# Patient Record
Sex: Female | Born: 1995 | Race: White | Hispanic: No | Marital: Married | Smoking: Never smoker
Health system: Southern US, Community
[De-identification: ages and names within clinical notes are randomized; demographics above are authoritative.]

---

## 2019-09-05 ENCOUNTER — Encounter (HOSPITAL_COMMUNITY): Payer: Self-pay

## 2019-09-05 ENCOUNTER — Emergency Department (HOSPITAL_COMMUNITY): Payer: HRSA Program

## 2019-09-05 ENCOUNTER — Other Ambulatory Visit: Payer: Self-pay

## 2019-09-05 DIAGNOSIS — U071 COVID-19: Secondary | ICD-10-CM | POA: Insufficient documentation

## 2019-09-05 DIAGNOSIS — R0602 Shortness of breath: Secondary | ICD-10-CM | POA: Diagnosis present

## 2019-09-05 NOTE — ED Triage Notes (Signed)
Covid + 09/03/2019. Shob and pain on inspiration. Cough and loss of taste.

## 2019-09-06 ENCOUNTER — Encounter (HOSPITAL_COMMUNITY): Payer: Self-pay

## 2019-09-06 ENCOUNTER — Emergency Department (HOSPITAL_COMMUNITY): Payer: HRSA Program

## 2019-09-06 ENCOUNTER — Emergency Department (HOSPITAL_COMMUNITY)
Admission: EM | Admit: 2019-09-06 | Discharge: 2019-09-06 | Disposition: A | Discharge status: Home / Self Care | Payer: HRSA Program | Attending: Emergency Medicine | Admitting: Emergency Medicine

## 2019-09-06 DIAGNOSIS — U071 COVID-19: Secondary | ICD-10-CM

## 2019-09-06 LAB — COMPREHENSIVE METABOLIC PANEL
ALT: 93 U/L — ABNORMAL HIGH (ref 0–44)
AST: 71 U/L — ABNORMAL HIGH (ref 15–41)
Albumin: 4.3 g/dL (ref 3.5–5.0)
Alkaline Phosphatase: 55 U/L (ref 38–126)
Anion gap: 11 (ref 5–15)
BUN: 6 mg/dL (ref 6–20)
CO2: 25 mmol/L (ref 22–32)
Calcium: 8.9 mg/dL (ref 8.9–10.3)
Chloride: 101 mmol/L (ref 98–111)
Creatinine, Ser: 0.88 mg/dL (ref 0.44–1.00)
GFR calc Af Amer: >60 mL/min (ref >60)
GFR calc non Af Amer: >60 mL/min (ref >60)
Glucose, Bld: 95 mg/dL (ref 70–99)
Potassium: 4.1 mmol/L (ref 3.5–5.1)
Sodium: 137 mmol/L (ref 135–145)
Total Bilirubin: 0.3 mg/dL (ref 0.3–1.2)
Total Protein: 8.3 g/dL — ABNORMAL HIGH (ref 6.5–8.1)

## 2019-09-06 LAB — CBC WITH DIFFERENTIAL/PLATELET
Abs Immature Granulocytes: 0 10*3/uL (ref 0.00–0.07)
Basophils Absolute: 0 10*3/uL (ref 0.0–0.1)
Basophils Relative: 1 %
Eosinophils Absolute: 0 10*3/uL (ref 0.0–0.5)
Eosinophils Relative: 0 %
HCT: 46.4 % — ABNORMAL HIGH (ref 36.0–46.0)
Hemoglobin: 15.3 g/dL — ABNORMAL HIGH (ref 12.0–15.0)
Immature Granulocytes: 0 %
Lymphocytes Relative: 46 %
Lymphs Abs: 1.2 10*3/uL (ref 0.7–4.0)
MCH: 28.4 pg (ref 26.0–34.0)
MCHC: 33 g/dL (ref 30.0–36.0)
MCV: 86.2 fL (ref 80.0–100.0)
Monocytes Absolute: 0.4 10*3/uL (ref 0.1–1.0)
Monocytes Relative: 15 %
Neutro Abs: 1 10*3/uL — ABNORMAL LOW (ref 1.7–7.7)
Neutrophils Relative %: 38 %
Platelets: 121 10*3/uL — ABNORMAL LOW (ref 150–400)
RBC: 5.38 MIL/uL — ABNORMAL HIGH (ref 3.87–5.11)
RDW: 12.2 % (ref 11.5–15.5)
WBC: 2.6 10*3/uL — ABNORMAL LOW (ref 4.0–10.5)
nRBC: 0 % (ref 0.0–0.2)

## 2019-09-06 LAB — I-STAT BETA HCG BLOOD, ED (MC, WL, AP ONLY): I-stat hCG, quantitative: <5 m[IU]/mL (ref <5)

## 2019-09-06 LAB — D-DIMER, QUANTITATIVE: D-Dimer, Quant: 0.51 ug/mL-FEU — ABNORMAL HIGH (ref 0.00–0.50)

## 2019-09-06 MED ORDER — LACTATED RINGERS IV BOLUS
1000.0000 mL | Freq: Once | INTRAVENOUS | Status: AC
  Administered 2019-09-06: 1000 mL via INTRAVENOUS

## 2019-09-06 MED ORDER — KETOROLAC TROMETHAMINE 15 MG/ML IJ SOLN
15.0000 mg | Freq: Once | INTRAMUSCULAR | Status: AC
  Administered 2019-09-06: 15 mg via INTRAVENOUS
  Filled 2019-09-06: qty 1

## 2019-09-06 MED ORDER — SODIUM CHLORIDE (PF) 0.9 % IJ SOLN
INTRAMUSCULAR | Status: AC
  Filled 2019-09-06: qty 50

## 2019-09-06 MED ORDER — IOHEXOL 350 MG/ML SOLN
100.0000 mL | Freq: Once | INTRAVENOUS | Status: AC | PRN
  Administered 2019-09-06: 100 mL via INTRAVENOUS

## 2019-09-06 MED ORDER — ACETAMINOPHEN 325 MG PO TABS
650.0000 mg | ORAL_TABLET | Freq: Once | ORAL | Status: AC
  Administered 2019-09-06: 650 mg via ORAL
  Filled 2019-09-06: qty 2

## 2019-09-06 NOTE — ED Provider Notes (Signed)
[facility]-EMERGENCY DEPT Provider Note   CSN: [id number] Arrival date & time: 09/05/19  2315     History Chief Complaint  Patient presents with  . Shortness of Breath    Christina Garrett is a 24 y.o. female.  HPI 24 year old female presents with Covid symptoms.  States 8 days ago she developed fever, cough, shortness of breath, generalized weakness and headache.  Symptoms seem to be progressively worsening.  3 days ago she tested positive for Covid-19.  States that since last night she seems to be even worse from a shortness of breath perspective.  Bilateral lower rib/upper abdomen hurt with deep breaths.  No hemoptysis.  Feels generally weak.  History reviewed. No pertinent past medical history.  There are no problems to display for this patient.   History reviewed. No pertinent surgical history.   OB History   No obstetric history on file.     No family history on file.  Social History   Tobacco Use  . Smoking status: Not on file  Substance Use Topics  . Alcohol use: Not on file  . Drug use: Not on file    Home Medications Prior to Admission medications   Not on File    Allergies    Patient has no known allergies.  Review of Systems   Review of Systems  Constitutional: Positive for fever.  Respiratory: Positive for cough and shortness of breath.   Cardiovascular: Negative for chest pain.  Gastrointestinal: Positive for abdominal pain (with coughing) and nausea. Negative for vomiting.  Genitourinary: Negative for menstrual problem.  Neurological: Positive for weakness.  All other systems reviewed and are negative.   Physical Exam Updated Vital Signs BP 117/74   Pulse 72   Temp 99.9 F (37.7 C) (Oral)   Resp 16   Ht 5\' 7"  (1.702 m)   Wt 68 kg   SpO2 96%   BMI 23.49 kg/m   Physical Exam Vitals and nursing note reviewed.  Constitutional:      General: She is not in acute distress.    Appearance: She is well-developed. She is  not ill-appearing or diaphoretic.  HENT:     Head: Normocephalic and atraumatic.     Right Ear: External ear normal.     Left Ear: External ear normal.     Nose: Nose normal.  Eyes:     General:        Right eye: No discharge.        Left eye: No discharge.  Cardiovascular:     Rate and Rhythm: Regular rhythm. Tachycardia present.     Heart sounds: Normal heart sounds.  Pulmonary:     Effort: Pulmonary effort is normal. No tachypnea or accessory muscle usage.     Breath sounds: Normal breath sounds. No wheezing, rhonchi or rales.  Abdominal:     Palpations: Abdomen is soft.     Tenderness: There is no abdominal tenderness.  Skin:    General: Skin is warm and dry.  Neurological:     Mental Status: She is alert.  Psychiatric:        Mood and Affect: Mood is not anxious.     ED Results / Procedures / Treatments   Labs (all labs ordered are listed, but only abnormal results are displayed) Labs Reviewed  COMPREHENSIVE METABOLIC PANEL - Abnormal; Notable for the following components:      Result Value   Total Protein 8.3 (*)    AST 71 (*)  ALT 93 (*)    All other components within normal limits  CBC WITH DIFFERENTIAL/PLATELET - Abnormal; Notable for the following components:   WBC 2.6 (*)    RBC 5.38 (*)    Hemoglobin 15.3 (*)    HCT 46.4 (*)    Platelets 121 (*)    Neutro Abs 1.0 (*)    All other components within normal limits  D-DIMER, QUANTITATIVE (NOT AT [facility]) - Abnormal; Notable for the following components:   D-Dimer, Quant 0.51 (*)    All other components within normal limits  I-STAT BETA HCG BLOOD, ED ([facility], [facility], [facility] ONLY)    EKG EKG Interpretation  Date/Time:  Thursday Sep 06 2019 96:22:29 EDT Ventricular Rate:  91 PR Interval:    QRS Duration: 87 QT Interval:  350 QTC Calculation: 431 R Axis:   72 Text Interpretation: Sinus rhythm RSR' in V1 or V2, probably normal variant Borderline T abnormalities, diffuse leads 12 Lead; Mason-Likar No old tracing to  compare Confirmed by Pricilla Loveless [id number]) on 09/06/2019 8:40:58 AM   Radiology DG Chest 2 View  Result Date: 09/05/2019 CLINICAL DATA:  Shortness of breath and cough EXAM: CHEST - 2 VIEW COMPARISON:  None. FINDINGS: Small left lower lobe infiltrate. No pleural effusion. Normal heart size. No pneumothorax IMPRESSION: Small left lower lobe pneumonia. Electronically Signed   By: Jasmine Pang M.D.   On: 09/05/2019 23:48   CT Angio Chest PE W and/or Wo Contrast  Result Date: 09/06/2019 CLINICAL DATA:  COVID positive, pulmonary embolism expected. Elevated D-dimer. EXAM: CT ANGIOGRAPHY CHEST WITH CONTRAST TECHNIQUE: Multidetector CT imaging of the chest was performed using the standard protocol during bolus administration of intravenous contrast. Multiplanar CT image reconstructions and MIPs were obtained to evaluate the vascular anatomy. CONTRAST:  OMNIPAQUE IOHEXOL 350 MG/ML SOLN COMPARISON:  Chest radiograph May 26 21 FINDINGS: Cardiovascular: No filling defects within the pulmonary arteries to suggest acute pulmonary embolism. Mediastinum/Nodes: No axillary supraclavicular adenopathy no mediastinal hilar adenopathy no pericardial effusion. Esophagus normal Lungs/Pleura: There is patchy peripheral airspace densities within the LEFT and RIGHT lower lobes. Similar rounded peripheral airspace densities in the upper lobes to a lesser extent. No pleural fluid. No pneumothorax. Upper Abdomen: Limited view of the liver, kidneys, pancreas are unremarkable. Normal adrenal glands. Musculoskeletal: No aggressive osseous lesion Review of the MIP images confirms the above findings. IMPRESSION: 1. No evidence acute pulmonary embolism. 2. Bilateral multifocal peripheral rounded airspace densities consistent with COVID viral pneumonia. Electronically Signed   By: Genevive Bi M.D.   On: 09/06/2019 11:35    Procedures Procedures (including critical care time)  Medications Ordered in ED Medications  sodium  chloride (PF) 0.9 % injection (has no administration in time range)  lactated ringers bolus 1,000 mL (0 mLs Intravenous Stopped 09/06/19 1146)  ketorolac (TORADOL) 15 MG/ML injection 15 mg (15 mg Intravenous Given 09/06/19 0850)  acetaminophen (TYLENOL) tablet 650 mg (650 mg Oral Given 09/06/19 0850)  iohexol (OMNIPAQUE) 350 MG/ML injection 100 mL (100 mLs Intravenous Contrast Given 09/06/19 1110)    ED Course  I have reviewed the triage vital signs and the nursing notes.  Pertinent labs & imaging results that were available during my care of the patient were reviewed by me and considered in my medical decision making (see chart for details).    MDM Rules/Calculators/A&P                      Given patient is on birth control  and with an acute worsening of her symptoms with COVID-19, work-up for PE obtained.  Labs are fairly benign besides mild leukopenia and mild LFT abnormality.  Minimal D-dimer elevation and thus CTA obtained but is unremarkable besides Covid-19.  Feeling better with Toradol and fluids.  Recommend continued quarantine and supportive care.   Bentleigh Waren was evaluated in Emergency Department on 09/06/2019 for the symptoms described in the history of present illness. She was evaluated in the context of the global COVID-19 pandemic, which necessitated consideration that the patient might be at risk for infection with the SARS-CoV-2 virus that causes COVID-19. Institutional protocols and algorithms that pertain to the evaluation of patients at risk for COVID-19 are in a state of rapid change based on information released by regulatory bodies including the CDC and federal and state organizations. These policies and algorithms were followed during the patient's care in the ED.  Final Clinical Impression(s) / ED Diagnoses Final diagnoses:  COVID-19 virus infection    Rx / DC Orders ED Discharge Orders    None       Sherwood Gambler, MD 09/06/19 [phone]

## 2021-09-11 ENCOUNTER — Emergency Department (HOSPITAL_BASED_OUTPATIENT_CLINIC_OR_DEPARTMENT_OTHER)
Admission: EM | Admit: 2021-09-11 | Discharge: 2021-09-11 | Disposition: A | Discharge status: Home / Self Care | Payer: Managed Care, Other (non HMO) | Attending: Emergency Medicine | Admitting: Emergency Medicine

## 2021-09-11 ENCOUNTER — Encounter (HOSPITAL_BASED_OUTPATIENT_CLINIC_OR_DEPARTMENT_OTHER): Payer: Self-pay | Admitting: *Deleted

## 2021-09-11 ENCOUNTER — Other Ambulatory Visit: Payer: Self-pay

## 2021-09-11 DIAGNOSIS — S61211A Laceration without foreign body of left index finger without damage to nail, initial encounter: Secondary | ICD-10-CM | POA: Insufficient documentation

## 2021-09-11 DIAGNOSIS — S60941A Unspecified superficial injury of left index finger, initial encounter: Secondary | ICD-10-CM | POA: Diagnosis present

## 2021-09-11 DIAGNOSIS — W260XXA Contact with knife, initial encounter: Secondary | ICD-10-CM | POA: Diagnosis not present

## 2021-09-11 NOTE — ED Notes (Signed)
Discharge instructions reviewed and explained, pt verbalized understanding and had no further questions at discharge. Pt caox4 and ambulatory at d/c.

## 2021-09-11 NOTE — ED Provider Notes (Signed)
[facility] EMERGENCY DEPT Provider Note   CSN: [id number] Arrival date & time: 09/11/21  1940     History  Chief Complaint  Patient presents with   Extremity Laceration    Christina Garrett is a 26 y.o. female.  Patient presents to the hospital complaining of a laceration to the left index finger.  Patient states that she was cutting an avocado when she slipped and the knife went into the base of the left finger.  She has good pulse much, movement, sensation in the left finger at this time.  Bleeding is controlled.  No other complaints.  Her last tetanus shot was this year  HPI     Home Medications Prior to Admission medications   Not on File      Allergies    Patient has no known allergies.    Review of Systems   Review of Systems  Musculoskeletal:  Negative for arthralgias.  Skin:  Positive for wound.   Physical Exam Updated Vital Signs BP 138/72 (BP Location: Right Arm)   Pulse 66   Temp 97.9 F (36.6 C) (Oral)   Resp 20   Wt 79.4 kg   SpO2 100%   BMI 27.41 kg/m  Physical Exam Vitals and nursing note reviewed.  Constitutional:      General: She is not in acute distress. HENT:     Head: Normocephalic and atraumatic.  Eyes:     Conjunctiva/sclera: Conjunctivae normal.  Cardiovascular:     Rate and Rhythm: Normal rate.  Pulmonary:     Effort: Pulmonary effort is normal.  Musculoskeletal:        General: Signs of injury present. No swelling or tenderness. Normal range of motion.     Cervical back: Normal range of motion and neck supple.  Skin:    General: Skin is warm and dry.     Comments: 5 mm vertical laceration noted at base of left index finger  Neurological:     Mental Status: She is alert and oriented to person, place, and time.    ED Results / Procedures / Treatments   Labs (all labs ordered are listed, but only abnormal results are displayed) Labs Reviewed - No data to display  EKG None  Radiology No results  found.  Procedures .Marland KitchenLaceration Repair  Date/Time: 09/11/2021 9:08 PM Performed by: Darrick Grinder, PA-C Authorized by: Darrick Grinder, PA-C   Consent:    Consent obtained:  Verbal   Consent given by:  Patient   Risks discussed:  Infection, need for additional repair, pain, poor cosmetic result and poor wound healing   Alternatives discussed:  No treatment and delayed treatment Universal protocol:    Procedure explained and questions answered to patient or proxy's satisfaction: yes     Relevant documents present and verified: yes     Required blood products, implants, devices, and special equipment available: yes     Site/side marked: yes     Immediately prior to procedure, a time out was called: yes     Patient identity confirmed:  Verbally with patient Anesthesia:    Anesthesia method:  None Laceration details:    Location:  Finger   Finger location:  L index finger   Length (cm):  0.5   Depth (mm):  3 Exploration:    Hemostasis achieved with:  Direct pressure Treatment:    Area cleansed with:  Saline   Amount of cleaning:  Standard Skin repair:    Repair method:  Tissue adhesive  Approximation:    Approximation:  Close Repair type:    Repair type:  Simple Post-procedure details:    Dressing:  Non-adherent dressing   Procedure completion:  Tolerated well, no immediate complications    Medications Ordered in ED Medications - No data to display  ED Course/ Medical Decision Making/ A&P                           Medical Decision Making  The patient presented with a laceration of the left index finger.  Pulse movement and sensation was intact distal to the laceration.  I saw no need for imaging at this time.  The wounds was cleansed as noted above and closed with surgical glue.  It was bandaged with a nonadhesive dressing.  The patient is discharged home with instructions for wound care.        Final Clinical Impression(s) / ED Diagnoses Final diagnoses:   Laceration of left index finger without foreign body without damage to nail, initial encounter    Rx / DC Orders ED Discharge Orders     None         Pamala Duffel 09/11/21 2110    Terrilee Files, MD 09/12/21 [phone]

## 2021-09-11 NOTE — ED Triage Notes (Signed)
Pt cut herself at the base pf her left index finger while cutting an avocado.  Bleeding controlled, about 35mm cut.  Last tetanus shot was this year

## 2021-09-11 NOTE — Discharge Instructions (Signed)
You were seen today for a finger laceration.  This was repaired with surgical glue.  Please let the glue fall off on its own.  Do not peel it off.  Follow-up as needed.

## 2021-09-18 IMAGING — CR DG CHEST 2V
2 series · 2 of 2 positions shown · non-contrast
Comparison: None.

CLINICAL DATA: Shortness of breath and cough

EXAM:
CHEST - 2 VIEW

[w chest pa]
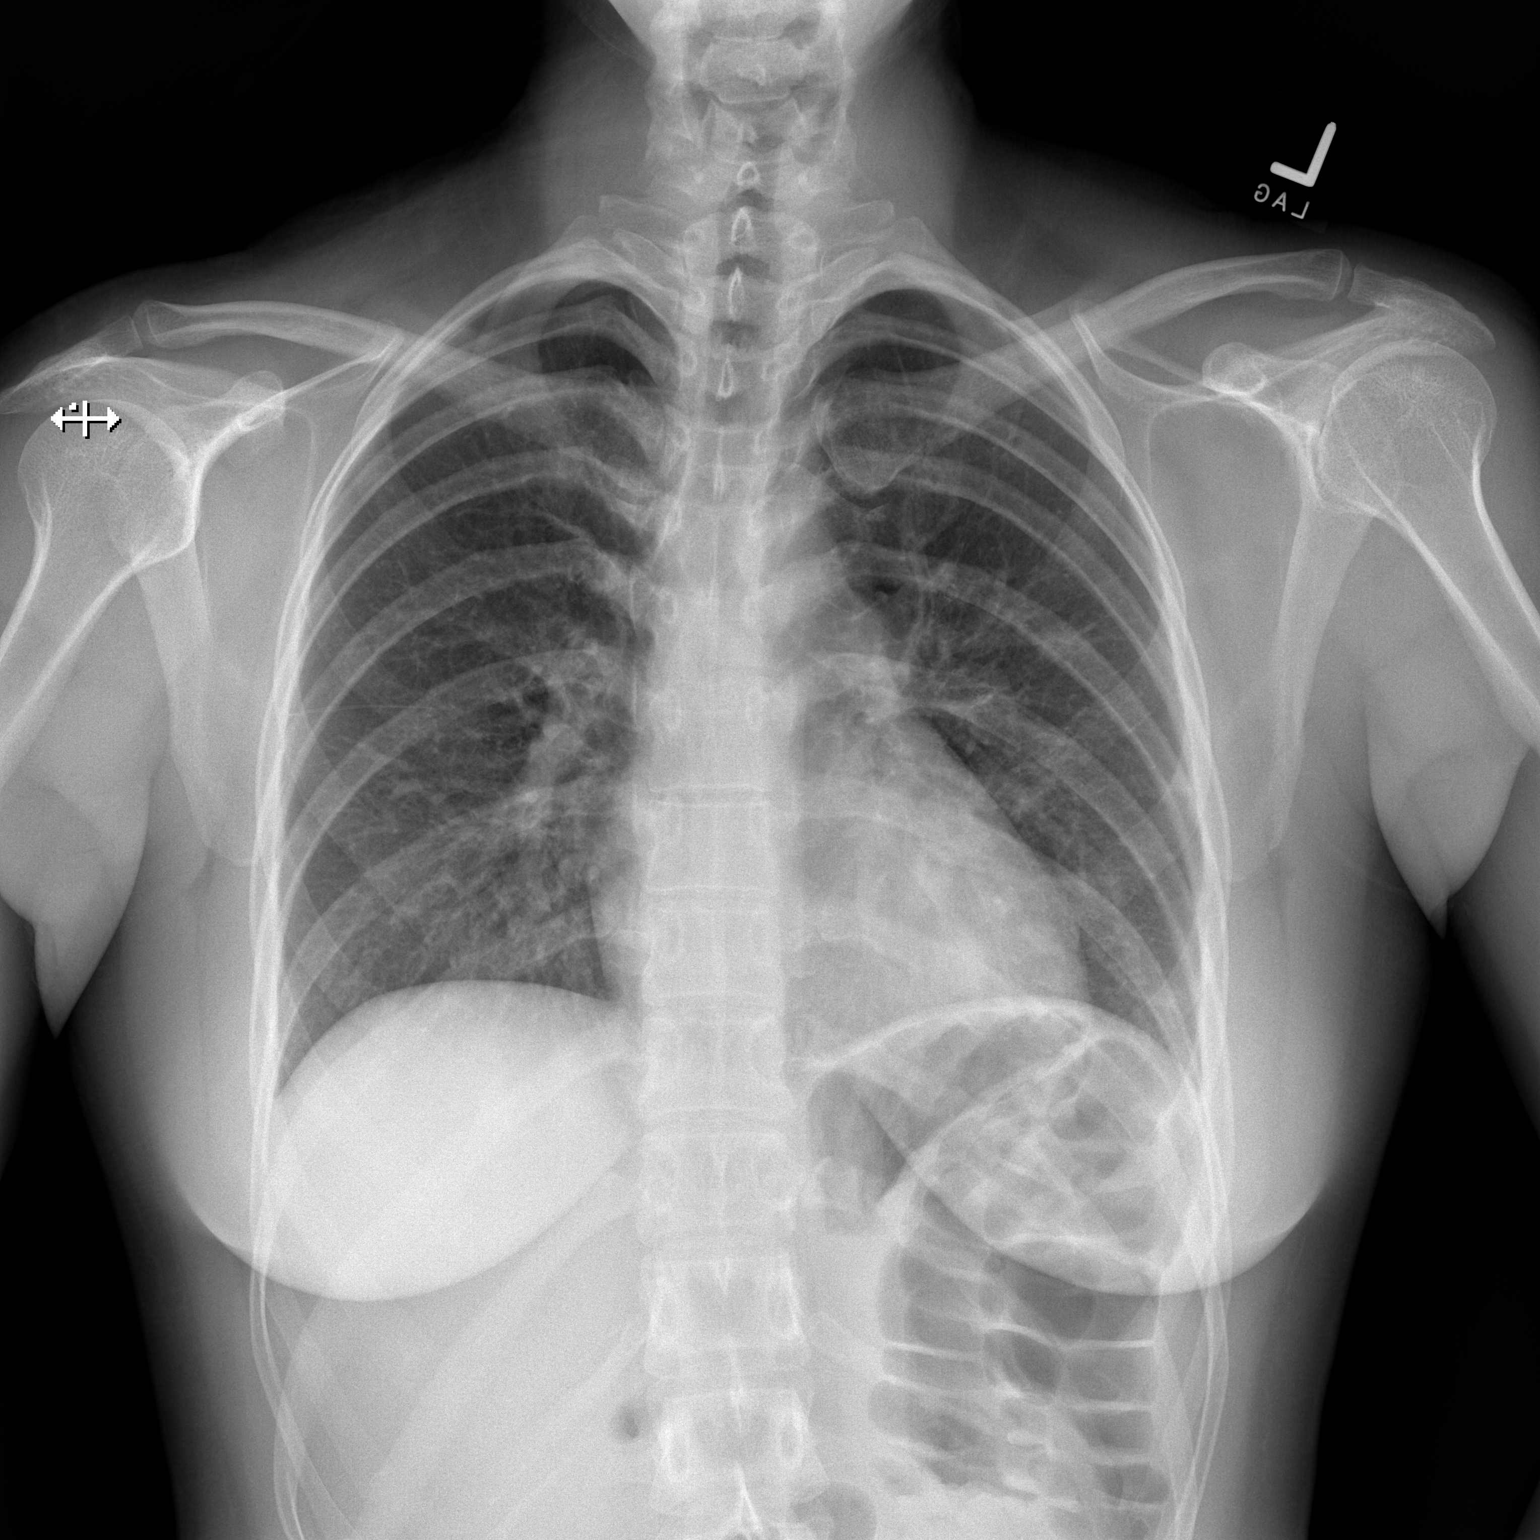

[w chest lat]
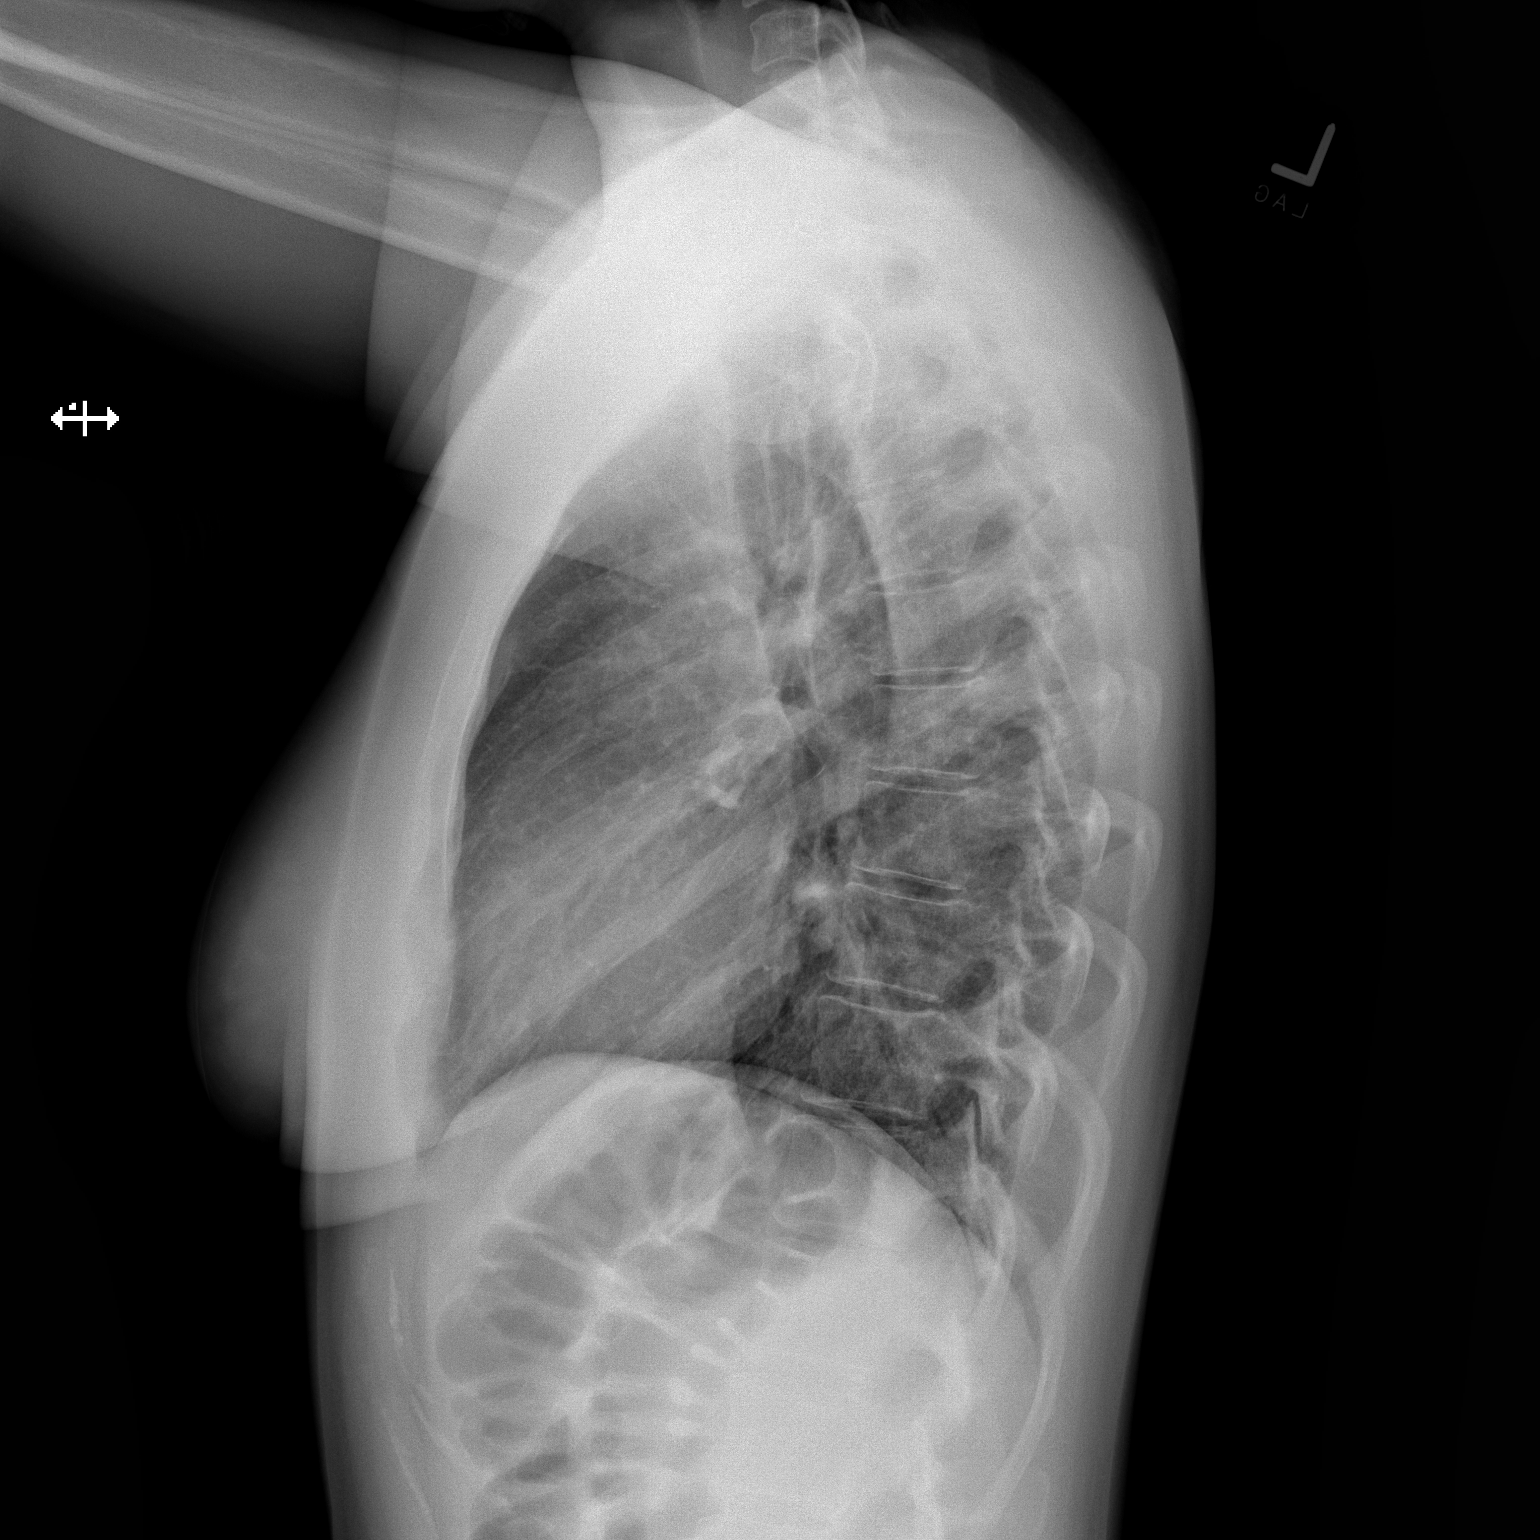

[2 of 2 positions shown; findings below may reference images not displayed]

FINDINGS: Small left lower lobe infiltrate. No pleural effusion. Normal heart
size. No pneumothorax
IMPRESSION: Small left lower lobe pneumonia.

## 2021-09-19 IMAGING — CT CT ANGIO CHEST
2 of 6 series · 18 of 36 positions shown · IV contrast (omnipaque)
Comparison: Chest radiograph September 05, 19

CLINICAL DATA: COVID positive, pulmonary embolism expected.
Elevated D-dimer.

EXAM:
CT ANGIOGRAPHY CHEST WITH CONTRAST
TECHNIQUE: Multidetector CT imaging of the chest was performed using the
standard protocol during bolus administration of intravenous
contrast. Multiplanar CT image reconstructions and MIPs were
obtained to evaluate the vascular anatomy.
CONTRAST:  100mL OMNIPAQUE IOHEXOL 350 MG/ML SOLN

[Series 6: thins · axial · 0.65mm/px · z∈[+1461,+1690]mm · 17 of 259 slices shown]
[im 15/259  lung]
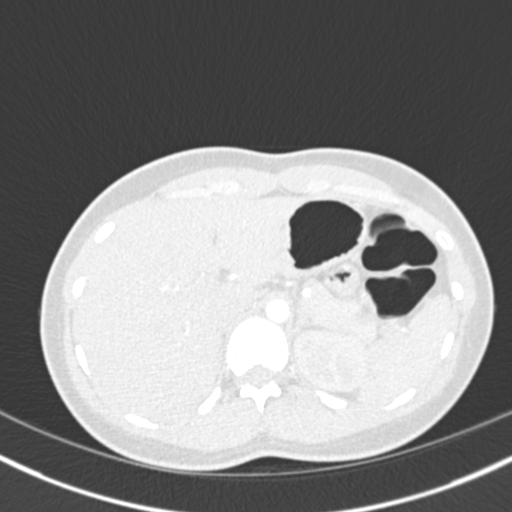
[im 29/259  mediastinal]
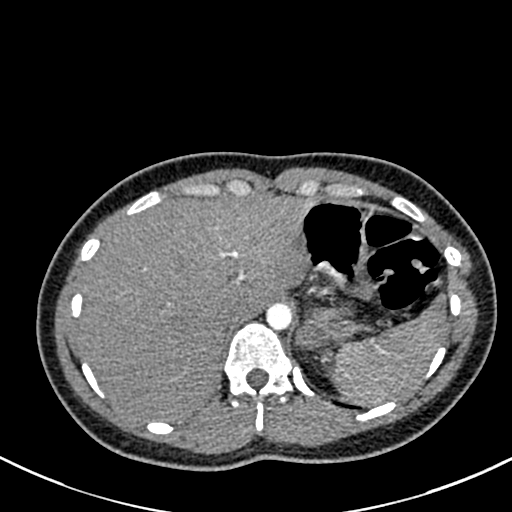
[im 44/259  lung]
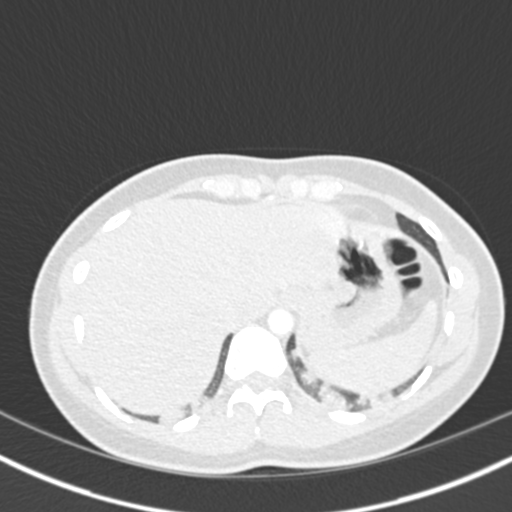
[im 58/259  mediastinal]
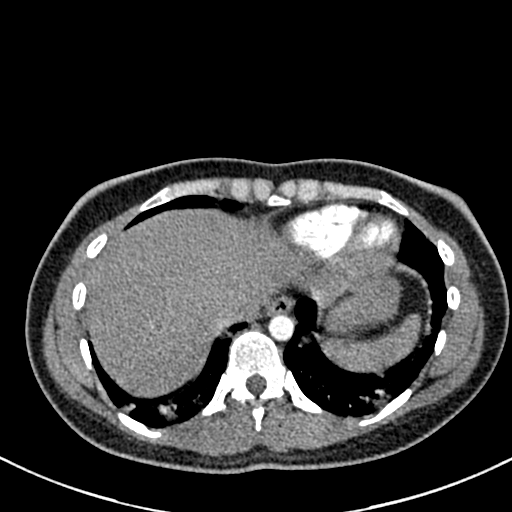
[im 72/259  lung]
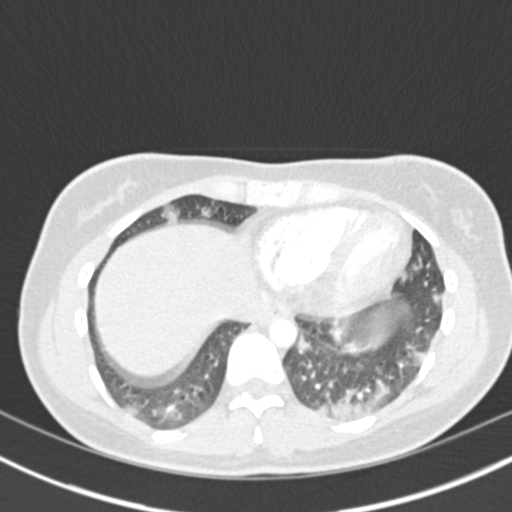
[im 87/259  mediastinal]
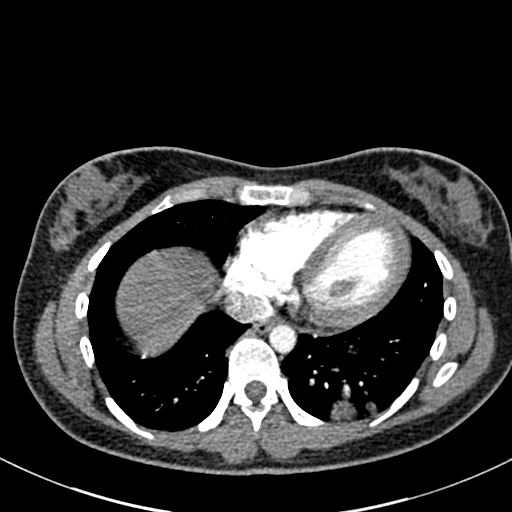
[im 101/259  lung]
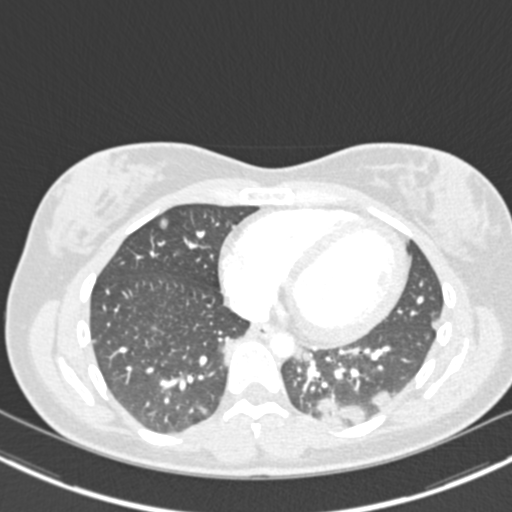
[im 115/259  mediastinal]
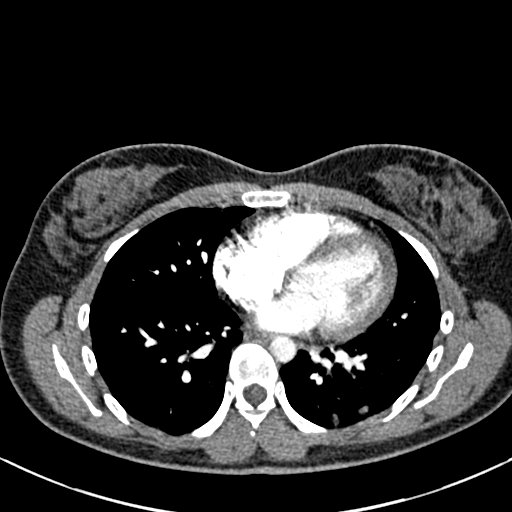
[im 130/259  lung]
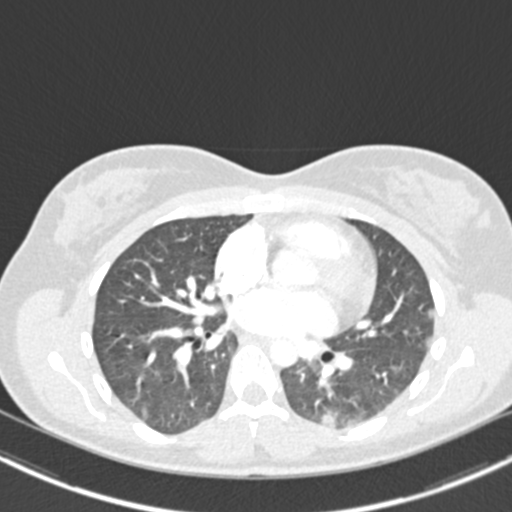
[im 144/259  mediastinal]
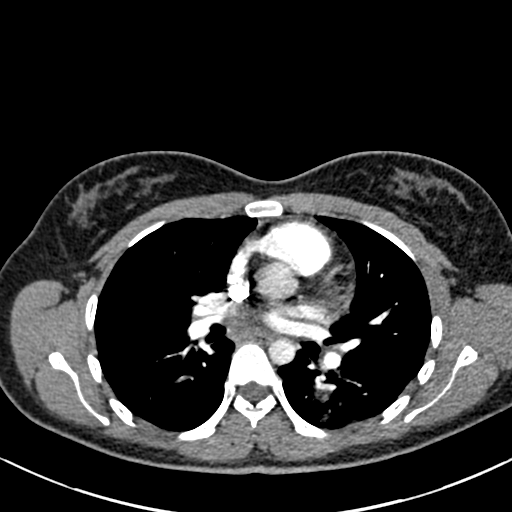
[im 158/259  lung]
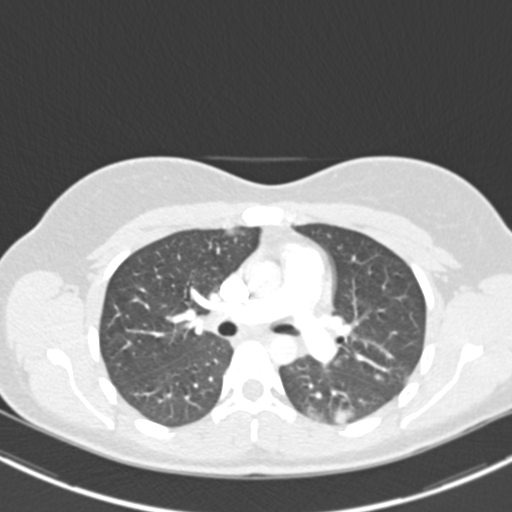
[im 173/259  mediastinal]
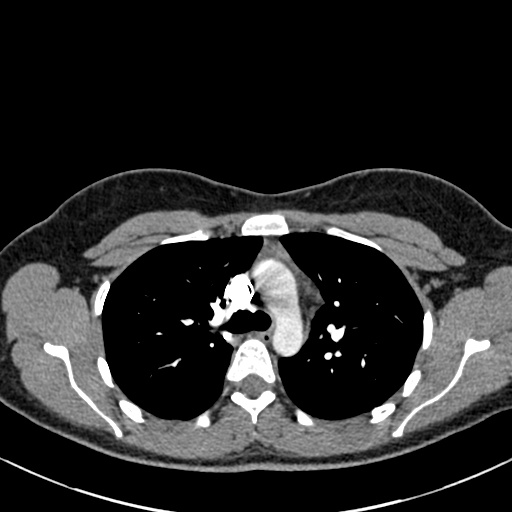
[im 187/259  lung]
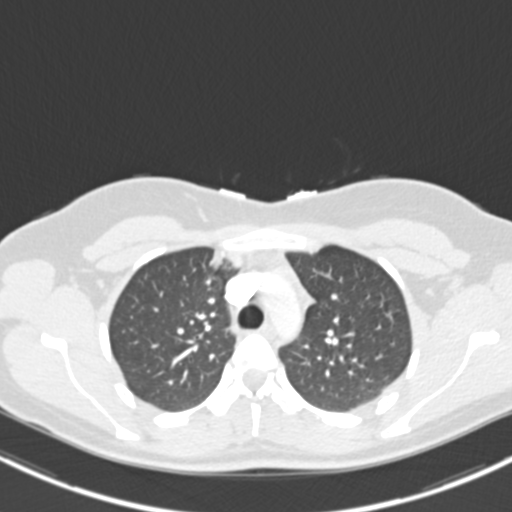
[im 201/259  mediastinal]
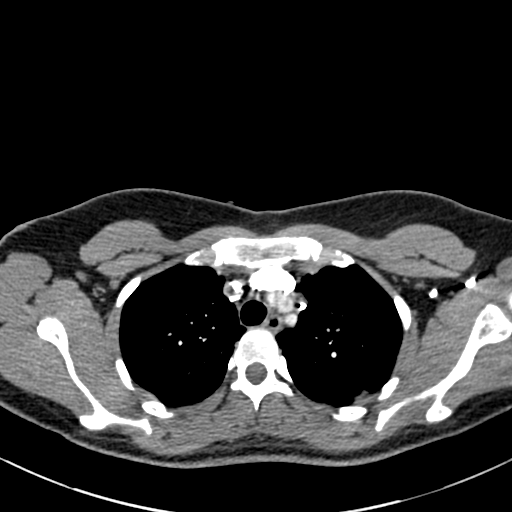
[im 216/259  lung]
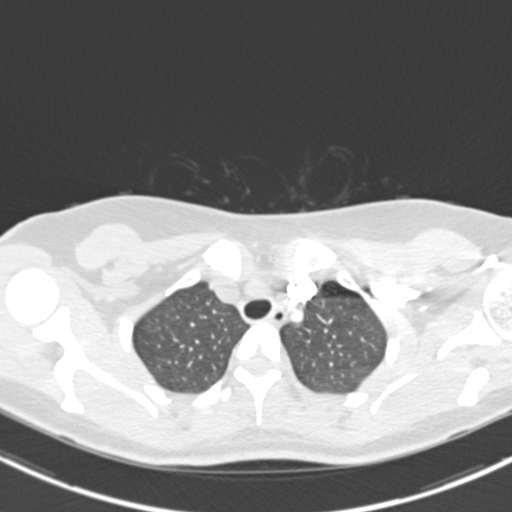
[im 230/259  mediastinal]
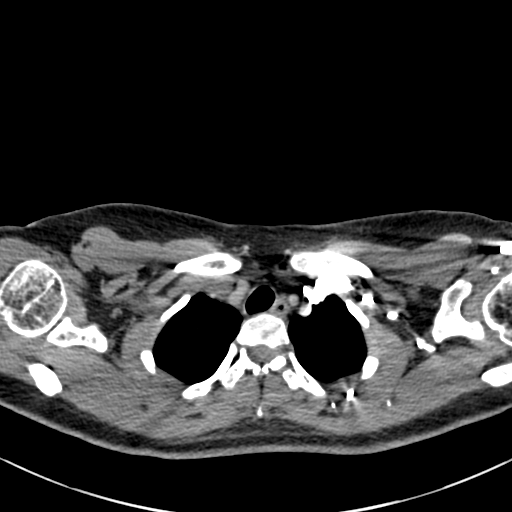
[im 244/259  lung]
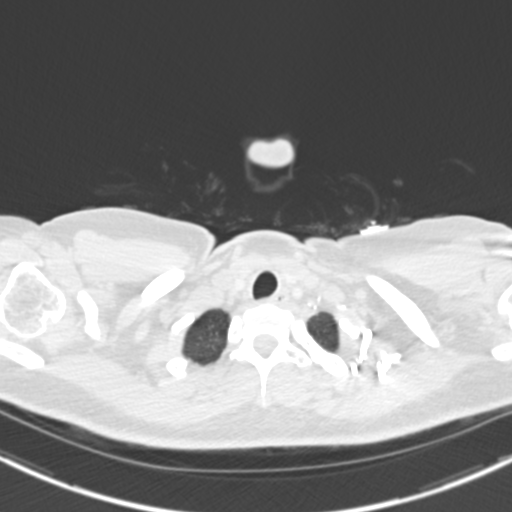

[Series 8: coronal mpr · coronal · 0.61mm/px · 1 of 112 slices shown]
[im 56/112  mediastinal]
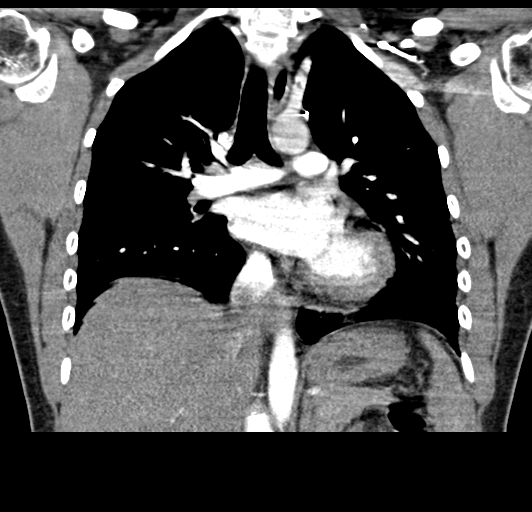

[18 of 36 positions shown; findings below may reference images not displayed]

FINDINGS: Cardiovascular: No filling defects within the pulmonary arteries to
suggest acute pulmonary embolism.

Mediastinum/Nodes: No axillary supraclavicular adenopathy no
mediastinal hilar adenopathy no pericardial effusion. Esophagus
normal

Lungs/Pleura: There is patchy peripheral airspace densities within
the LEFT and RIGHT lower lobes. Similar rounded peripheral airspace
densities in the upper lobes to a lesser extent. No pleural fluid.
No pneumothorax.

Upper Abdomen: Limited view of the liver, kidneys, pancreas are
unremarkable. Normal adrenal glands.

Musculoskeletal: No aggressive osseous lesion

Review of the MIP images confirms the above findings.
IMPRESSION: 1. No evidence acute pulmonary embolism.
2. Bilateral multifocal peripheral rounded airspace densities
consistent with COVID viral pneumonia.

## 2022-05-27 ENCOUNTER — Other Ambulatory Visit: Payer: Self-pay | Admitting: Cardiovascular Disease

## 2022-05-27 DIAGNOSIS — Z0000 Encounter for general adult medical examination without abnormal findings: Secondary | ICD-10-CM

## 2022-05-31 ENCOUNTER — Ambulatory Visit (INDEPENDENT_AMBULATORY_CARE_PROVIDER_SITE_OTHER): Payer: PRIVATE HEALTH INSURANCE | Admitting: Cardiovascular Disease

## 2022-05-31 DIAGNOSIS — Z0000 Encounter for general adult medical examination without abnormal findings: Secondary | ICD-10-CM

## 2022-06-15 NOTE — Progress Notes (Signed)
Closing encounter for provider.

## 2024-02-28 ENCOUNTER — Other Ambulatory Visit: Payer: Self-pay

## 2024-02-28 MED ORDER — CAYA VA DPRH
VAGINAL_INSERT | VAGINAL | 0 refills | Status: AC
  Filled 2024-02-28: qty 1, 90d supply, fill #0

## 2024-02-29 ENCOUNTER — Other Ambulatory Visit: Payer: Self-pay

## 2024-03-05 ENCOUNTER — Other Ambulatory Visit: Payer: Self-pay
